# Patient Record
Sex: Male | Born: 1975 | Race: White | Hispanic: No | Marital: Married
Health system: Southern US, Community
[De-identification: ages and names within clinical notes are randomized; demographics above are authoritative.]

## PROBLEM LIST (undated history)

## (undated) HISTORY — PX: NOSE SURGERY: SHX723

## (undated) HISTORY — PX: TONSILLECTOMY: SUR1361

---

## 2016-11-05 ENCOUNTER — Emergency Department (HOSPITAL_COMMUNITY): Payer: Self-pay

## 2016-11-05 ENCOUNTER — Encounter (HOSPITAL_COMMUNITY): Payer: Self-pay | Admitting: Emergency Medicine

## 2016-11-05 ENCOUNTER — Emergency Department (HOSPITAL_COMMUNITY)
Admission: EM | Admit: 2016-11-05 | Discharge: 2016-11-05 | Disposition: A | Discharge status: Home / Self Care | Payer: Self-pay | Attending: Emergency Medicine | Admitting: Emergency Medicine

## 2016-11-05 DIAGNOSIS — R079 Chest pain, unspecified: Secondary | ICD-10-CM | POA: Insufficient documentation

## 2016-11-05 DIAGNOSIS — Z5321 Procedure and treatment not carried out due to patient leaving prior to being seen by health care provider: Secondary | ICD-10-CM | POA: Insufficient documentation

## 2016-11-05 LAB — CBC
HCT: 45.9 % (ref 39.0–52.0)
HEMOGLOBIN: 16 g/dL (ref 13.0–17.0)
MCH: 31.6 pg (ref 26.0–34.0)
MCHC: 34.9 g/dL (ref 30.0–36.0)
MCV: 90.7 fL (ref 78.0–100.0)
PLATELETS: 235 10*3/uL (ref 150–400)
RBC: 5.06 MIL/uL (ref 4.22–5.81)
RDW: 13.1 % (ref 11.5–15.5)
WBC: 9.9 10*3/uL (ref 4.0–10.5)

## 2016-11-05 LAB — POCT I-STAT TROPONIN I: TROPONIN I, POC: 0 ng/mL (ref 0.00–0.08)

## 2016-11-05 LAB — BASIC METABOLIC PANEL
ANION GAP: 10 (ref 5–15)
BUN: 13 mg/dL (ref 6–20)
CALCIUM: 9.6 mg/dL (ref 8.9–10.3)
CHLORIDE: 102 mmol/L (ref 101–111)
CO2: 28 mmol/L (ref 22–32)
CREATININE: 1.19 mg/dL (ref 0.61–1.24)
GFR calc Af Amer: >60 mL/min (ref >60)
GFR calc non Af Amer: >60 mL/min (ref >60)
Glucose, Bld: 125 mg/dL — ABNORMAL HIGH (ref 65–99)
Potassium: 3.9 mmol/L (ref 3.5–5.1)
SODIUM: 140 mmol/L (ref 135–145)

## 2016-11-05 NOTE — ED Triage Notes (Addendum)
Patient c/o intermittent "burning" from upper abdomen to central chest since yesterday. Denies SOB, N/V/D.

## 2016-11-05 NOTE — ED Notes (Addendum)
Pt politely stated that he could not wait any longer and he was leaving.

## 2018-04-25 IMAGING — CR DG CHEST 2V
2 series · 2 of 2 positions shown · non-contrast
Comparison: None.

CLINICAL DATA: Chest pain and shortness of breath for 2 days.

EXAM:
CHEST  2 VIEW

[w chest pa]
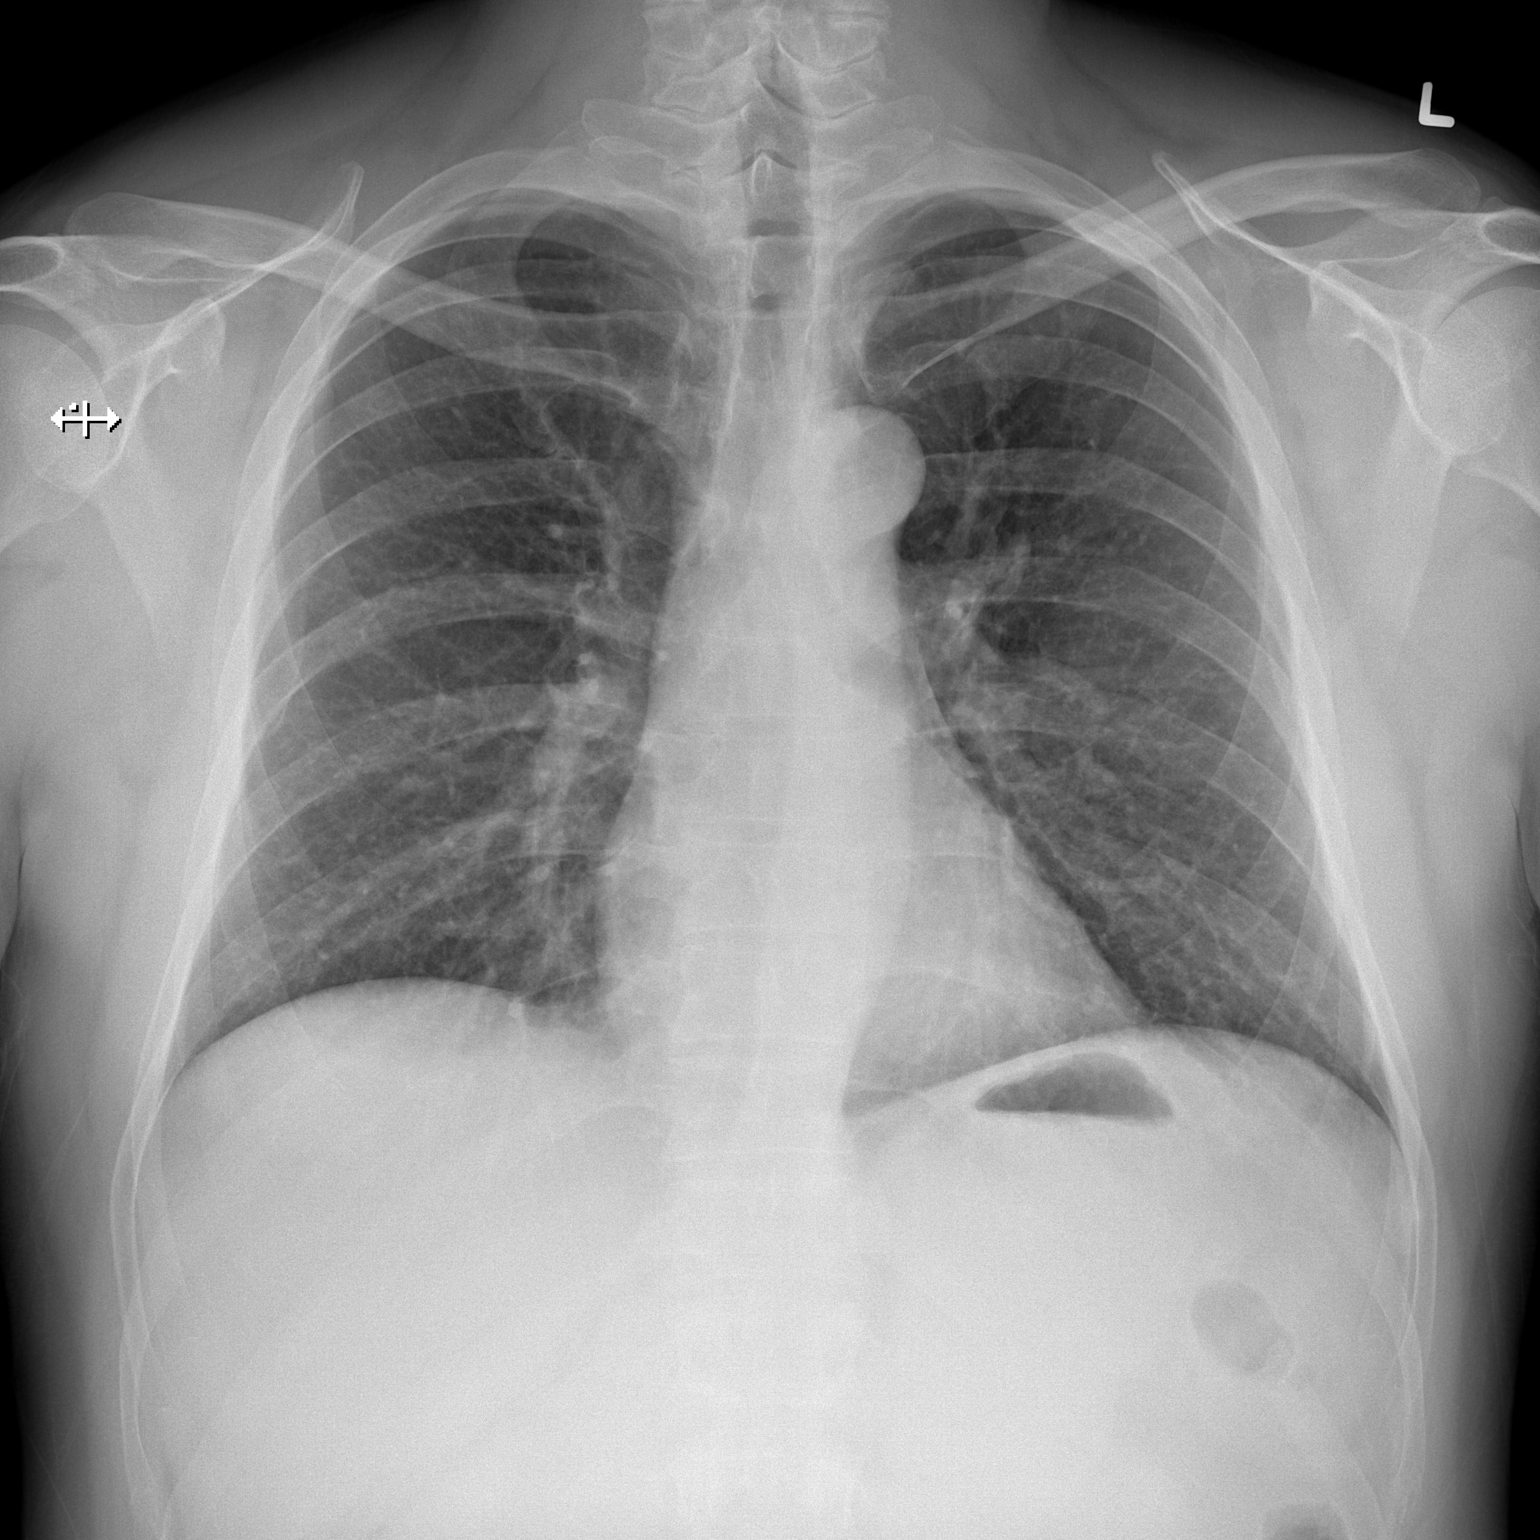

[w chest lat]
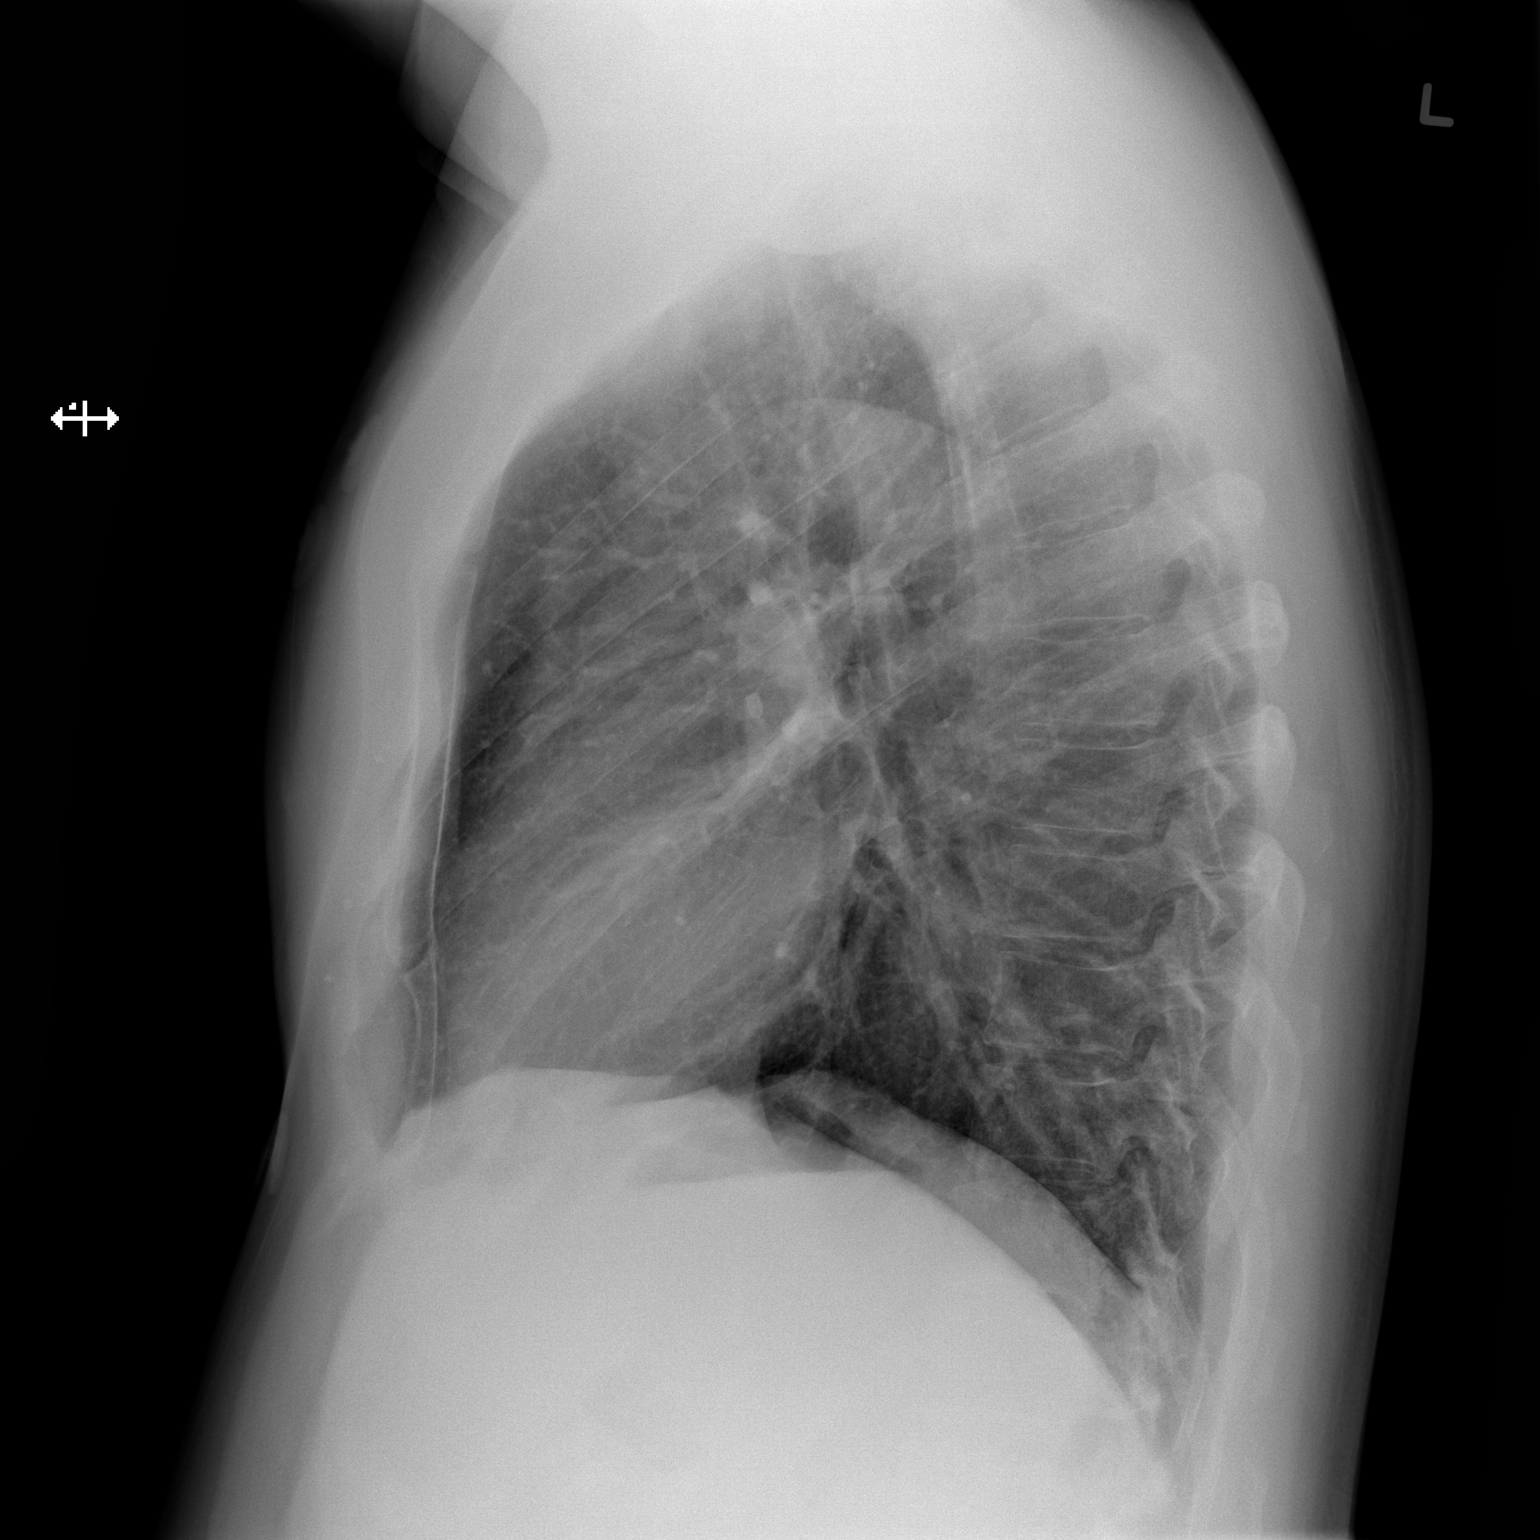

[2 of 2 positions shown; findings below may reference images not displayed]

FINDINGS: Cardiomediastinal silhouette is normal. Mediastinal contours appear
intact.

There is no evidence of focal airspace consolidation, pleural
effusion or pneumothorax.

Osseous structures are without acute abnormality. Soft tissues are
grossly normal.
IMPRESSION: No active cardiopulmonary disease.
